# Patient Record
Sex: Male | Born: 2000 | Race: Black or African American | Hispanic: No | Marital: Single | Smoking: Never smoker
Health system: Southern US, Community
[De-identification: ages and names within clinical notes are randomized; demographics above are authoritative.]

## PROBLEM LIST (undated history)

## (undated) DIAGNOSIS — A6000 Herpesviral infection of urogenital system, unspecified: Secondary | ICD-10-CM

## (undated) DIAGNOSIS — G4730 Sleep apnea, unspecified: Secondary | ICD-10-CM

## (undated) HISTORY — DX: Sleep apnea, unspecified: G47.30

---
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
## 2018-01-02 MED FILL — LEVOTHYROXINE 112 MCG TAB: 112 | 30 days supply | Qty: 30 | Fill #2

## 2018-02-07 MED FILL — LEVOTHYROXINE 112 MCG TAB: 112 | 30 days supply | Qty: 30 | Fill #0

## 2018-03-05 MED FILL — LEVOTHYROXINE 112 MCG TAB: 112 | 30 days supply | Qty: 30 | Fill #1

## 2018-04-10 MED FILL — LEVOTHYROXINE 112 MCG TAB: 112 | 30 days supply | Qty: 30 | Fill #0

## 2018-05-11 MED FILL — LEVOTHYROXINE 112 MCG TAB: 112 | 30 days supply | Qty: 30 | Fill #1

## 2018-06-15 MED FILL — LEVOTHYROXINE 112 MCG TAB: 112 | 30 days supply | Qty: 30 | Fill #2

## 2018-07-20 MED FILL — LEVOTHYROXINE 112 MCG TAB: 112 | 30 days supply | Qty: 30 | Fill #3

## 2018-09-03 MED FILL — LEVOTHYROXINE 112 MCG TAB: 112 | 30 days supply | Qty: 30 | Fill #4

## 2018-09-28 MED FILL — LEVOTHYROXINE 112 MCG TAB: 112 | 30 days supply | Qty: 30 | Fill #5

## 2018-11-07 MED FILL — LEVOTHYROXINE 112 MCG TAB: 112 | 30 days supply | Qty: 30 | Fill #6 | Status: TO

## 2018-12-11 MED FILL — LEVOTHYROXINE 112 MCG TAB: 112 | 30 days supply | Qty: 30 | Fill #0

## 2019-01-04 MED FILL — MUPIROCIN 2% OINTMENT: 2 | 10 days supply | Qty: 22 | Fill #0

## 2019-01-04 MED FILL — DOXYCYCLINE HYCLATE 100 MG: 100 | 10 days supply | Qty: 20 | Fill #0

## 2019-01-09 MED FILL — LEVOTHYROXINE 112 MCG TAB: 112 | 30 days supply | Qty: 30 | Fill #1

## 2022-08-31 ENCOUNTER — Encounter (HOSPITAL_COMMUNITY): Payer: Self-pay

## 2022-08-31 ENCOUNTER — Ambulatory Visit (HOSPITAL_COMMUNITY)
Admission: EM | Admit: 2022-08-31 | Discharge: 2022-08-31 | Disposition: A | Discharge status: Home / Self Care | Payer: Medicaid Other | Attending: Urgent Care | Admitting: Urgent Care

## 2022-08-31 DIAGNOSIS — R300 Dysuria: Secondary | ICD-10-CM | POA: Diagnosis present

## 2022-08-31 DIAGNOSIS — L309 Dermatitis, unspecified: Secondary | ICD-10-CM

## 2022-08-31 LAB — POCT URINALYSIS DIPSTICK, ED / UC
Bilirubin Urine: NEGATIVE
Glucose, UA: NEGATIVE mg/dL
Hgb urine dipstick: NEGATIVE
Ketones, ur: NEGATIVE mg/dL
Nitrite: NEGATIVE
Protein, ur: NEGATIVE mg/dL
Specific Gravity, Urine: 1.02 (ref 1.005–1.030)
Urobilinogen, UA: 0.2 mg/dL (ref 0.0–1.0)
pH: 7 (ref 5.0–8.0)

## 2022-08-31 MED ORDER — PREDNISONE 10 MG (21) PO TBPK: ORAL_TABLET | Freq: Every day | ORAL | 0 refills | Status: DC

## 2022-08-31 NOTE — Discharge Instructions (Signed)
Your urine does not show signs of infection. Your discomfort with urination is concerning for possible STD.  We obtained testing for gonorrhea, chlamydia, trichomonas today. We will call with the results if positive. Avoid ALL forms of intercourse until testing/ treating completed. Regarding your rash, you may continue the Benadryl.  I have prescribed you a taper pack of prednisone. Please call your primary care physician and schedule an allergy evaluation as this may be related to something in your workplace.

## 2022-08-31 NOTE — ED Provider Notes (Signed)
[facility]    CSN: [id number] Arrival date & time: 08/31/22  0844      History   Chief Complaint Chief Complaint  Patient presents with   Rash   Exposure to STD    HPI Blake Swanson is a 21 y.o. male.   21 year old male presents today with 2 concerns.  He states he works in a [employer], [employer], states that every time he goes to work he gets itchy bumps on his face.  He states that the bumps respond to over-the-counter hydrocortisone cream and Benadryl.  Reports it is intensely pruritic when the bumps are present.  The rash is primarily around his eyes, nasal bridge, and upper mouth.  Denies any pain or drainage from the rash.  It does not affect any other portion of his body.  He reports on the days he does not work, he does not have the rash.  He denies any systemic symptoms. Additionally, patient reports dysuria for the past week.  He denies any rash, lesions, discharge.  He denies any hematuria, flank pain or fever.   Rash Exposure to STD    Past Medical History:  Diagnosis Date   Sleep apnea     Patient Active Problem List   Diagnosis Date Noted   Obstructive sleep apnea 11/29/2016   Seasonal and perennial allergic rhinitis 11/29/2016   Graves disease 11/29/2016    History reviewed. No pertinent surgical history.     Home Medications    Prior to Admission medications   Medication Sig Start Date End Date Taking? Authorizing Provider  predniSONE (STERAPRED UNI-PAK 21 TAB) 10 MG (21) TBPK tablet Take by mouth daily. Take 6 tabs by mouth daily  for 1 days, then 5 tabs for 1 days, then 4 tabs for 1 days, then 3 tabs for 1 days, 2 tabs for 1 days, then 1 tab by mouth daily for 1 days 08/31/22  Yes Shamel Germond L, PA  ALLEGRA-D ALLERGY & CONGESTION 180-240 MG 24 hr tablet Take 1 tablet by mouth daily. 11/24/16   [provider]  cholecalciferol (VITAMIN D) 1000 units tablet Take 1,000 Units by mouth daily.    [provider]  levothyroxine (SYNTHROID, LEVOTHROID) 100 MCG tablet Take 1 tablet by mouth daily. 11/24/16   [provider]    Family History Family History  Problem Relation Age of Onset   Hypertension Maternal Grandmother     Social History Social History   Tobacco Use   Smoking status: Never   Smokeless tobacco: Never  Vaping Use   Vaping Use: Never used  Substance Use Topics   Alcohol use: No   Drug use: No     Allergies   Patient has no known allergies.   Review of Systems Review of Systems  Genitourinary:  Positive for dysuria.  Skin:  Positive for rash.  All other systems reviewed and are negative.    Physical Exam Triage Vital Signs ED Triage Vitals  Enc Vitals Group     BP 08/31/22 1116 (!) 141/91     Pulse Rate 08/31/22 1116 64     Resp 08/31/22 1116 16     Temp 08/31/22 1116 98 F (36.7 C)     Temp Source 08/31/22 1116 Oral     SpO2 08/31/22 1116 98 %     Weight --      Height --      Head Circumference --      Peak Flow --  Pain Score 08/31/22 1114 6     Pain Loc --      Pain Edu? --      Excl. in [place]? --    No data found.  Updated Vital Signs BP (!) 141/91 (BP Location: Left Arm)   Pulse 64   Temp 98 F (36.7 C) (Oral)   Resp 16   SpO2 98%   Visual Acuity Right Eye Distance:   Left Eye Distance:   Bilateral Distance:    Right Eye Near:   Left Eye Near:    Bilateral Near:     Physical Exam Vitals and nursing note reviewed.  Constitutional:      General: He is not in acute distress.    Appearance: Normal appearance. He is not ill-appearing, toxic-appearing or diaphoretic.  HENT:     Head: Normocephalic and atraumatic.     Right Ear: External ear normal.     Left Ear: External ear normal.     Nose: Nose normal. No congestion or rhinorrhea.     Mouth/Throat:     Mouth: Mucous membranes are moist.     Pharynx: Oropharynx is clear. No oropharyngeal exudate or posterior oropharyngeal erythema.  Eyes:     General: No  scleral icterus.       Right eye: No discharge.        Left eye: No discharge.     Extraocular Movements: Extraocular movements intact.     Conjunctiva/sclera: Conjunctivae normal.     Pupils: Pupils are equal, round, and reactive to light.  Cardiovascular:     Rate and Rhythm: Normal rate.  Pulmonary:     Effort: Pulmonary effort is normal. No respiratory distress.  Musculoskeletal:     Cervical back: Normal range of motion and neck supple. No rigidity or tenderness.  Lymphadenopathy:     Cervical: No cervical adenopathy.  Skin:    General: Skin is warm and dry.     Findings: Rash (fine papules scattered around T zone of face, including periocular regions. None noted around mouth at present time.) present. No erythema.  Neurological:     General: No focal deficit present.     Mental Status: He is alert and oriented to person, place, and time.     Sensory: No sensory deficit.     Motor: No weakness.      UC Treatments / Results  Labs (all labs ordered are listed, but only abnormal results are displayed) Labs Reviewed  POCT URINALYSIS DIPSTICK, ED / UC - Abnormal; Notable for the following components:      Result Value   Leukocytes,Ua TRACE (*)    All other components within normal limits  CYTOLOGY, (ORAL, ANAL, URETHRAL) ANCILLARY ONLY    EKG   Radiology No results found.  Procedures Procedures (including critical care time)  Medications Ordered in UC Medications - No data to display  Initial Impression / Assessment and Plan / UC Course  I have reviewed the triage vital signs and the nursing notes.  Pertinent labs & imaging results that were available during my care of the patient were reviewed by me and considered in my medical decision making (see chart for details).     Dysuria -urine is not indicative of a urinary tract infection.  Clinically concerning for an STD.  Urethral swab obtained, patient to avoid sexual intercourse until testing completed and  treatment completed if applicable. Periocular dermatitis -patient skin rash appears to be related to some type of allergen in his workplace.  He does find some response to Benadryl and hydrocortisone.  Will place him on a prednisone taper pack.  I encouraged him to call his PCP and schedule a follow-up to consider allergy testing.  Final Clinical Impressions(s) / UC Diagnoses   Final diagnoses:  Dysuria  Periocular dermatitis     Discharge Instructions      Your urine does not show signs of infection. Your discomfort with urination is concerning for possible STD.  We obtained testing for gonorrhea, chlamydia, trichomonas today. We will call with the results if positive. Avoid ALL forms of intercourse until testing/ treating completed. Regarding your rash, you may continue the Benadryl.  I have prescribed you a taper pack of prednisone. Please call your primary care physician and schedule an allergy evaluation as this may be related to something in your workplace.     ED Prescriptions     Medication Sig Dispense Auth. Provider   predniSONE (STERAPRED UNI-PAK 21 TAB) 10 MG (21) TBPK tablet Take by mouth daily. Take 6 tabs by mouth daily  for 1 days, then 5 tabs for 1 days, then 4 tabs for 1 days, then 3 tabs for 1 days, 2 tabs for 1 days, then 1 tab by mouth daily for 1 days 21 tablet Jeziah Kretschmer L, PA      PDMP not reviewed this encounter.   Maretta Bees, [place] 08/31/22 1150

## 2022-08-31 NOTE — ED Triage Notes (Signed)
Pt is here for possible allergic reaction on face . P[t states the rash flares up when at work. Pt also would like to be to checked for possible STD due to burning when urinating x1wk

## 2022-09-01 LAB — CYTOLOGY, (ORAL, ANAL, URETHRAL) ANCILLARY ONLY
Chlamydia: POSITIVE — AB
Comment: NEGATIVE
Comment: NEGATIVE
Comment: NORMAL
Neisseria Gonorrhea: NEGATIVE
Trichomonas: NEGATIVE

## 2022-09-02 ENCOUNTER — Telehealth (HOSPITAL_COMMUNITY): Payer: Self-pay | Admitting: Emergency Medicine

## 2022-09-02 MED ORDER — DOXYCYCLINE HYCLATE 100 MG PO CAPS: 100.0000 mg | ORAL_CAPSULE | Freq: Two times a day (BID) | ORAL | 0 refills | Status: AC

## 2022-09-10 ENCOUNTER — Ambulatory Visit (HOSPITAL_COMMUNITY)
Admission: EM | Admit: 2022-09-10 | Discharge: 2022-09-10 | Disposition: A | Discharge status: Home / Self Care | Payer: Medicaid Other | Attending: Emergency Medicine | Admitting: Emergency Medicine

## 2022-09-10 ENCOUNTER — Encounter (HOSPITAL_COMMUNITY): Payer: Self-pay

## 2022-09-10 DIAGNOSIS — R82998 Other abnormal findings in urine: Secondary | ICD-10-CM | POA: Diagnosis present

## 2022-09-10 LAB — POCT URINALYSIS DIPSTICK, ED / UC
Bilirubin Urine: NEGATIVE
Glucose, UA: NEGATIVE mg/dL
Hgb urine dipstick: NEGATIVE
Ketones, ur: NEGATIVE mg/dL
Leukocytes,Ua: NEGATIVE
Nitrite: NEGATIVE
Protein, ur: NEGATIVE mg/dL
Specific Gravity, Urine: 1.01 (ref 1.005–1.030)
Urobilinogen, UA: 0.2 mg/dL (ref 0.0–1.0)
pH: 6.5 (ref 5.0–8.0)

## 2022-09-10 NOTE — ED Provider Notes (Signed)
[facility]    CSN: [id number] Arrival date & time: 09/10/22  1003      History   Chief Complaint Chief Complaint  Patient presents with   Exposure to STD    HPI Blake Swanson is a 22 y.o. male.   Patient presents for evaluation of persisting dark urine for 2 weeks.  Was evaluated in urgent care on 08/31/2022, diagnosed with chlamydia, endorses completing doxycycline course 2 days ago however symptoms did not resolve.  Requesting recheck.  Denies new symptoms.  Denies sexual activity since beginning treatment.    Past Medical History:  Diagnosis Date   Sleep apnea     Patient Active Problem List   Diagnosis Date Noted   Obstructive sleep apnea 11/29/2016   Seasonal and perennial allergic rhinitis 11/29/2016   Graves disease 11/29/2016    No past surgical history on file.     Home Medications    Prior to Admission medications   Medication Sig Start Date End Date Taking? Authorizing Provider  ALLEGRA-D ALLERGY & CONGESTION 180-240 MG 24 hr tablet Take 1 tablet by mouth daily. 11/24/16   [provider]  cholecalciferol (VITAMIN D) 1000 units tablet Take 1,000 Units by mouth daily.    [provider]  levothyroxine (SYNTHROID, LEVOTHROID) 100 MCG tablet Take 1 tablet by mouth daily. 11/24/16   [provider]  predniSONE (STERAPRED UNI-PAK 21 TAB) 10 MG (21) TBPK tablet Take by mouth daily. Take 6 tabs by mouth daily  for 1 days, then 5 tabs for 1 days, then 4 tabs for 1 days, then 3 tabs for 1 days, 2 tabs for 1 days, then 1 tab by mouth daily for 1 days 08/31/22   Chaney Malling, PA    Family History Family History  Problem Relation Age of Onset   Hypertension Maternal Grandmother     Social History Social History   Tobacco Use   Smoking status: Never   Smokeless tobacco: Never  Vaping Use   Vaping Use: Never used  Substance Use Topics   Alcohol use: No   Drug use: No     Allergies   Patient has no known  allergies.   Review of Systems Review of Systems   Physical Exam Triage Vital Signs ED Triage Vitals  Enc Vitals Group     BP 09/10/22 1015 (!) 149/84     Pulse Rate 09/10/22 1015 (!) 53     Resp 09/10/22 1015 16     Temp 09/10/22 1015 98.3 F (36.8 C)     Temp Source 09/10/22 1015 Oral     SpO2 09/10/22 1015 98 %     Weight --      Height --      Head Circumference --      Peak Flow --      Pain Score 09/10/22 1023 0     Pain Loc --      Pain Edu? --      Excl. in [place]? --    No data found.  Updated Vital Signs BP (!) 149/84 (BP Location: Left Arm)   Pulse (!) 53   Temp 98.3 F (36.8 C) (Oral)   Resp 16   SpO2 98%   Visual Acuity Right Eye Distance:   Left Eye Distance:   Bilateral Distance:    Right Eye Near:   Left Eye Near:    Bilateral Near:     Physical Exam Constitutional:      Appearance:  Normal appearance.  Eyes:     Extraocular Movements: Extraocular movements intact.  Pulmonary:     Effort: Pulmonary effort is normal.  Genitourinary:    Penis: Normal and uncircumcised. No discharge.      Testes: Normal.  Neurological:     Mental Status: He is alert and oriented to person, place, and time. Mental status is at baseline.      UC Treatments / Results  Labs (all labs ordered are listed, but only abnormal results are displayed) Labs Reviewed - No data to display  EKG   Radiology No results found.  Procedures Procedures (including critical care time)  Medications Ordered in UC Medications - No data to display  Initial Impression / Assessment and Plan / UC Course  I have reviewed the triage vital signs and the nursing notes.  Pertinent labs & imaging results that were available during my care of the patient were reviewed by me and considered in my medical decision making (see chart for details).  Dark urine  Urinalysis negative, STI labs pending, advised increase fluid intake and monitoring, may follow-up with urgent care if  symptoms persist or worsen Final Clinical Impressions(s) / UC Diagnoses   Final diagnoses:  None   Discharge Instructions   None    ED Prescriptions   None    PDMP not reviewed this encounter.   Valinda Hoar, NP 09/10/22 1059

## 2022-09-10 NOTE — Discharge Instructions (Addendum)
Urinalysis is negative for bladder infection  STI labs are pending, you will be treated based on test results, you will be notified of positive test results only and treatment sent in at time of notification, you will have to return to clinic if positive for gonorrhea  Please increase your fluid intake to further help flush out your kidneys bladder  Please refrain from having sex until all lab results have returned

## 2022-09-10 NOTE — ED Triage Notes (Signed)
Pt is here for brownish penile discharge x 2 days.

## 2022-09-12 LAB — CYTOLOGY, (ORAL, ANAL, URETHRAL) ANCILLARY ONLY
Chlamydia: POSITIVE — AB
Comment: NEGATIVE
Comment: NEGATIVE
Comment: NORMAL
Neisseria Gonorrhea: NEGATIVE
Trichomonas: NEGATIVE

## 2022-09-13 ENCOUNTER — Telehealth (HOSPITAL_COMMUNITY): Payer: Self-pay | Admitting: Emergency Medicine

## 2022-09-13 MED ORDER — DOXYCYCLINE HYCLATE 100 MG PO CAPS: 100.0000 mg | ORAL_CAPSULE | Freq: Two times a day (BID) | ORAL | 0 refills | Status: AC

## 2022-09-22 ENCOUNTER — Encounter (HOSPITAL_COMMUNITY): Payer: Self-pay

## 2022-09-22 ENCOUNTER — Ambulatory Visit (HOSPITAL_COMMUNITY)
Admission: EM | Admit: 2022-09-22 | Discharge: 2022-09-22 | Disposition: A | Discharge status: Home / Self Care | Payer: Medicaid Other | Attending: Sports Medicine | Admitting: Sports Medicine

## 2022-09-22 DIAGNOSIS — Z113 Encounter for screening for infections with a predominantly sexual mode of transmission: Secondary | ICD-10-CM

## 2022-09-22 DIAGNOSIS — S39012A Strain of muscle, fascia and tendon of lower back, initial encounter: Secondary | ICD-10-CM | POA: Insufficient documentation

## 2022-09-22 LAB — POCT URINALYSIS DIPSTICK, ED / UC
Bilirubin Urine: NEGATIVE
Glucose, UA: NEGATIVE mg/dL
Hgb urine dipstick: NEGATIVE
Ketones, ur: NEGATIVE mg/dL
Leukocytes,Ua: NEGATIVE
Nitrite: NEGATIVE
Protein, ur: NEGATIVE mg/dL
Specific Gravity, Urine: 1.01 (ref 1.005–1.030)
Urobilinogen, UA: 0.2 mg/dL (ref 0.0–1.0)
pH: 6.5 (ref 5.0–8.0)

## 2022-09-22 MED ORDER — METHOCARBAMOL 500 MG PO TABS: 500.0000 mg | ORAL_TABLET | Freq: Every evening | ORAL | 0 refills | Status: DC

## 2022-09-22 NOTE — ED Triage Notes (Signed)
Patient presenting for follow up of chlamydia positive. Finished antibiotics and wants another test.   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 No urinary symptoms.

## 2022-09-22 NOTE — ED Provider Notes (Addendum)
[facility]    CSN: [id number] Arrival date & time: 09/22/22  0802      History   Chief Complaint Chief Complaint  Patient presents with   SEXUALLY TRANSMITTED DISEASE    HPI Blake Swanson is a 22 y.o. male.  Patient presents requesting STD retesting.  Patient was previously seen on 08/31/2022 and 09/10/2022 at this clinic, he initially tested positive for chlamydia and stated he began taking the doxycycline treatment on 09/02/2022, he reports that he finished the antibiotic treatment and had another positive chlamydia test on his second visit on 09/10/2022, he states that he has not had any sexual activity since before his initial visit in December.  He received education from our staff RN on CDC protocols for retesting.  He reports that he would like to be retested now.  He denies any STD symptoms, he denies any penile drainage, testicular pain, pelvic pain, abdominal pain, fever, chills, or any urinary symptoms.  He is requesting a urinalysis.  Patient presents complaining of lower bilateral back pain that started over this past week.  He reports that onset of symptoms began while he was at work lifting items.  He reports that symptoms worsen with lifting objects or bending.  He denies any fall or trauma.  He denies any numbness or tingling down any of his lower extremities.  He denies any urinary symptoms.  He denies any previous injury to his back in the past.  He denies any previous orthopedic surgeries on his back.  He has taken Tylenol and used a heating pad with minimal relief of symptoms.  HPI  Past Medical History:  Diagnosis Date   Sleep apnea     Patient Active Problem List   Diagnosis Date Noted   Obstructive sleep apnea 11/29/2016   Seasonal and perennial allergic rhinitis 11/29/2016   Graves disease 11/29/2016    History reviewed. No pertinent surgical history.     Home Medications    Prior to Admission medications   Medication Sig Start Date End  Date Taking? Authorizing Provider  ALLEGRA-D ALLERGY & CONGESTION 180-240 MG 24 hr tablet Take 1 tablet by mouth daily. 11/24/16  Yes [provider]  levothyroxine (SYNTHROID, LEVOTHROID) 100 MCG tablet Take 1 tablet by mouth daily. 11/24/16  Yes [provider]  methocarbamol (ROBAXIN) 500 MG tablet Take 1 tablet (500 mg total) by mouth at bedtime. 09/22/22  Yes Flossie Dibble, NP  cholecalciferol (VITAMIN D) 1000 units tablet Take 1,000 Units by mouth daily.    [provider]    Family History Family History  Problem Relation Age of Onset   Hypertension Maternal Grandmother     Social History Social History   Tobacco Use   Smoking status: Never   Smokeless tobacco: Never  Vaping Use   Vaping Use: Never used  Substance Use Topics   Alcohol use: No   Drug use: No     Allergies   Patient has no known allergies.   Review of Systems Review of Systems Per HPI  Physical Exam Triage Vital Signs ED Triage Vitals  Enc Vitals Group     BP 09/22/22 0829 (!) 141/92     Pulse Rate 09/22/22 0829 (!) 53     Resp 09/22/22 0829 16     Temp 09/22/22 0829 97.9 F (36.6 C)     Temp Source 09/22/22 0829 Oral     SpO2 09/22/22 0829 98 %     Weight 09/22/22 0829 170  lb (77.1 kg)     Height 09/22/22 0829 5\' 6"  (1.676 m)     Head Circumference --      Peak Flow --      Pain Score 09/22/22 0828 4     Pain Loc --      Pain Edu? --      Excl. in [place]? --    No data found.  Updated Vital Signs BP (!) 141/92 (BP Location: Left Arm)   Pulse (!) 53   Temp 97.9 F (36.6 C) (Oral)   Resp 16   Ht 5\' 6"  (1.676 m)   Wt 170 lb (77.1 kg)   SpO2 98%   BMI 27.44 kg/m      Physical Exam Vitals and nursing note reviewed.  Constitutional:      Appearance: Normal appearance.  Genitourinary:    Comments: Deferred exam Musculoskeletal:     Cervical back: Normal.     Thoracic back: Normal.     Lumbar back: No swelling, edema, spasms, tenderness or bony  tenderness. Normal range of motion. Negative right straight leg raise test and negative left straight leg raise test.       Back:     Comments: No abnormalities noted to the skin on the lumbar area, no rash, no bruising, no erythema, or any changes to the skin.   Neurological:     Mental Status: He is alert.      UC Treatments / Results  Labs (all labs ordered are listed, but only abnormal results are displayed) Labs Reviewed  POCT URINALYSIS DIPSTICK, ED / UC  CYTOLOGY, (ORAL, ANAL, URETHRAL) ANCILLARY ONLY    EKG   Radiology No results found.  Procedures Procedures (including critical care time)  Medications Ordered in UC Medications - No data to display  Initial Impression / Assessment and Plan / UC Course  I have reviewed the triage vital signs and the nursing notes.  Pertinent labs & imaging results that were available during my care of the patient were reviewed by me and considered in my medical decision making (see chart for details).     Patient was evaluated for STD screening and strain of lumbar region.  Urinalysis was unremarkable.  Cytology swab is pending.  Patient was made aware of CDC protocol with normal retesting taken place and 3 months from initial encounter, patient was educated on the possibility that a false positive result could occur.  He was made aware of results reporting protocol and MyChart. Patient was prescribed Robaxin for lumbar strain.  Patient was made aware prescribed regiment and he was made aware of safety precautions with muscle relaxant.  Patient was educated on using proper body mechanics at work.  Patient was made aware of timeline for symptom resolution.  Patient verbalized understanding of instructions.  Charting was provided using a a verbal dictation system, charting was proofread for errors, errors may occur which could change the meaning of the information charted.   Final Clinical Impressions(s) / UC Diagnoses   Final  diagnoses:  Screening for STD (sexually transmitted disease)  Strain of lumbar region, initial encounter     Discharge Instructions      We will call you if any of your test results warrant a change in plan of care, you may view these test results on MyChart.   Robaxin is a muscle relaxant, its been sent to the pharmacy.  Please take 1 tablet before bedtime.  Please do not operate any heavy machinery or drive  a car after taking this medication.  You may continue to use a heating pad on the affected area.  Please return if your symptoms or not improving.   Please remember to use proper body mechanics while at work when lifting objects.      ED Prescriptions     Medication Sig Dispense Auth. Provider   methocarbamol (ROBAXIN) 500 MG tablet Take 1 tablet (500 mg total) by mouth at bedtime. 6 tablet Debby Freiberg, NP      PDMP not reviewed this encounter.   Debby Freiberg, NP 09/22/22 1009    Debby Freiberg, NP 09/22/22 1010

## 2022-09-22 NOTE — Discharge Instructions (Addendum)
We will call you if any of your test results warrant a change in plan of care, you may view these test results on MyChart.   Robaxin is a muscle relaxant, its been sent to the pharmacy.  Please take 1 tablet before bedtime.  Please do not operate any heavy machinery or drive a car after taking this medication.  You may continue to use a heating pad on the affected area.  Please return if your symptoms or not improving.   Please remember to use proper body mechanics while at work when lifting objects.

## 2022-09-23 LAB — CYTOLOGY, (ORAL, ANAL, URETHRAL) ANCILLARY ONLY
Chlamydia: NEGATIVE
Comment: NEGATIVE
Comment: NEGATIVE
Comment: NORMAL
Neisseria Gonorrhea: NEGATIVE
Trichomonas: NEGATIVE

## 2023-06-08 ENCOUNTER — Ambulatory Visit (HOSPITAL_COMMUNITY): Payer: Medicaid Other

## 2023-11-11 DIAGNOSIS — Z7251 High risk heterosexual behavior: Secondary | ICD-10-CM | POA: Diagnosis not present

## 2023-11-24 DIAGNOSIS — R0789 Other chest pain: Secondary | ICD-10-CM | POA: Diagnosis not present

## 2023-11-24 DIAGNOSIS — R7989 Other specified abnormal findings of blood chemistry: Secondary | ICD-10-CM | POA: Diagnosis not present

## 2023-11-24 DIAGNOSIS — L853 Xerosis cutis: Secondary | ICD-10-CM | POA: Diagnosis not present

## 2023-11-27 DIAGNOSIS — E291 Testicular hypofunction: Secondary | ICD-10-CM | POA: Diagnosis not present

## 2023-12-07 DIAGNOSIS — E890 Postprocedural hypothyroidism: Secondary | ICD-10-CM | POA: Diagnosis not present

## 2023-12-23 DIAGNOSIS — Z7251 High risk heterosexual behavior: Secondary | ICD-10-CM | POA: Diagnosis not present

## 2023-12-23 DIAGNOSIS — Z113 Encounter for screening for infections with a predominantly sexual mode of transmission: Secondary | ICD-10-CM | POA: Diagnosis not present

## 2023-12-25 DIAGNOSIS — J3089 Other allergic rhinitis: Secondary | ICD-10-CM | POA: Diagnosis not present

## 2023-12-25 DIAGNOSIS — J301 Allergic rhinitis due to pollen: Secondary | ICD-10-CM | POA: Diagnosis not present

## 2023-12-25 DIAGNOSIS — H1045 Other chronic allergic conjunctivitis: Secondary | ICD-10-CM | POA: Diagnosis not present

## 2023-12-25 DIAGNOSIS — J3081 Allergic rhinitis due to animal (cat) (dog) hair and dander: Secondary | ICD-10-CM | POA: Diagnosis not present

## 2023-12-29 DIAGNOSIS — N489 Disorder of penis, unspecified: Secondary | ICD-10-CM | POA: Diagnosis not present

## 2023-12-29 DIAGNOSIS — L304 Erythema intertrigo: Secondary | ICD-10-CM | POA: Diagnosis not present

## 2024-01-15 DIAGNOSIS — B009 Herpesviral infection, unspecified: Secondary | ICD-10-CM | POA: Diagnosis not present

## 2024-01-18 DIAGNOSIS — A6001 Herpesviral infection of penis: Secondary | ICD-10-CM | POA: Diagnosis not present

## 2024-01-18 DIAGNOSIS — L309 Dermatitis, unspecified: Secondary | ICD-10-CM | POA: Diagnosis not present

## 2024-02-02 DIAGNOSIS — L304 Erythema intertrigo: Secondary | ICD-10-CM | POA: Diagnosis not present

## 2024-02-02 DIAGNOSIS — L308 Other specified dermatitis: Secondary | ICD-10-CM | POA: Diagnosis not present

## 2024-02-17 DIAGNOSIS — N489 Disorder of penis, unspecified: Secondary | ICD-10-CM | POA: Diagnosis not present

## 2024-03-18 DIAGNOSIS — Z13220 Encounter for screening for lipoid disorders: Secondary | ICD-10-CM | POA: Diagnosis not present

## 2024-03-18 DIAGNOSIS — E890 Postprocedural hypothyroidism: Secondary | ICD-10-CM | POA: Diagnosis not present

## 2024-03-18 DIAGNOSIS — L853 Xerosis cutis: Secondary | ICD-10-CM | POA: Diagnosis not present

## 2024-03-18 DIAGNOSIS — L309 Dermatitis, unspecified: Secondary | ICD-10-CM | POA: Diagnosis not present

## 2024-03-18 DIAGNOSIS — Z131 Encounter for screening for diabetes mellitus: Secondary | ICD-10-CM | POA: Diagnosis not present

## 2024-03-18 DIAGNOSIS — B009 Herpesviral infection, unspecified: Secondary | ICD-10-CM | POA: Diagnosis not present

## 2024-03-18 DIAGNOSIS — Z79624 Long term (current) use of inhibitors of nucleotide synthesis: Secondary | ICD-10-CM | POA: Diagnosis not present

## 2024-03-18 DIAGNOSIS — Z0000 Encounter for general adult medical examination without abnormal findings: Secondary | ICD-10-CM | POA: Diagnosis not present

## 2024-03-18 DIAGNOSIS — E559 Vitamin D deficiency, unspecified: Secondary | ICD-10-CM | POA: Diagnosis not present

## 2024-03-20 ENCOUNTER — Other Ambulatory Visit (HOSPITAL_COMMUNITY): Payer: Self-pay

## 2024-03-21 ENCOUNTER — Other Ambulatory Visit (HOSPITAL_COMMUNITY): Payer: Self-pay

## 2024-03-22 ENCOUNTER — Other Ambulatory Visit (HOSPITAL_COMMUNITY): Payer: Self-pay

## 2024-03-25 ENCOUNTER — Other Ambulatory Visit (HOSPITAL_COMMUNITY): Payer: Self-pay

## 2024-03-25 ENCOUNTER — Other Ambulatory Visit: Payer: Self-pay

## 2024-03-25 MED ORDER — KETOCONAZOLE 2 % EX CREA: 1.0000 | TOPICAL_CREAM | Freq: Two times a day (BID) | CUTANEOUS | 3 refills | Status: DC

## 2024-03-25 MED ORDER — FLUTICASONE PROPIONATE 0.05 % EX CREA: 1.0000 | TOPICAL_CREAM | Freq: Two times a day (BID) | CUTANEOUS | 3 refills | Status: AC | PRN

## 2024-03-25 MED ORDER — AZELASTINE HCL 0.05 % OP SOLN: 1.0000 [drp] | Freq: Two times a day (BID) | OPHTHALMIC | 3 refills | Status: DC

## 2024-03-25 MED ORDER — LEVOCETIRIZINE DIHYDROCHLORIDE 5 MG PO TABS: 5.0000 mg | ORAL_TABLET | Freq: Every evening | ORAL | 3 refills | Status: AC

## 2024-03-25 MED ORDER — LEVOTHYROXINE SODIUM 125 MCG PO TABS
125.0000 ug | ORAL_TABLET | Freq: Every day | ORAL | 2 refills | Status: AC
  Filled 2024-03-25: qty 90, 90d supply, fill #0
  Filled 2024-06-24: qty 90, 90d supply, fill #1
  Filled 2024-09-11 (×2): qty 90, 90d supply, fill #2

## 2024-03-25 MED ORDER — FLUTICASONE PROPIONATE 50 MCG/ACT NA SUSP: 1.0000 | Freq: Every day | NASAL | 3 refills | Status: AC

## 2024-03-25 MED ORDER — VALACYCLOVIR HCL 500 MG PO TABS
500.0000 mg | ORAL_TABLET | Freq: Every day | ORAL | 3 refills | Status: AC
  Filled 2024-10-02: qty 90, 90d supply, fill #0

## 2024-03-26 ENCOUNTER — Other Ambulatory Visit (HOSPITAL_COMMUNITY): Payer: Self-pay

## 2024-03-29 ENCOUNTER — Ambulatory Visit (HOSPITAL_COMMUNITY)
Admission: EM | Admit: 2024-03-29 | Discharge: 2024-03-29 | Disposition: A | Discharge status: Home / Self Care | Attending: Physician Assistant | Admitting: Physician Assistant

## 2024-03-29 ENCOUNTER — Ambulatory Visit (HOSPITAL_COMMUNITY): Payer: Self-pay

## 2024-03-29 ENCOUNTER — Encounter (HOSPITAL_COMMUNITY): Payer: Self-pay

## 2024-03-29 DIAGNOSIS — Z113 Encounter for screening for infections with a predominantly sexual mode of transmission: Secondary | ICD-10-CM | POA: Diagnosis not present

## 2024-03-29 DIAGNOSIS — N5089 Other specified disorders of the male genital organs: Secondary | ICD-10-CM | POA: Insufficient documentation

## 2024-03-29 LAB — HIV ANTIBODY (ROUTINE TESTING W REFLEX): HIV Screen 4th Generation wRfx: NONREACTIVE

## 2024-03-29 NOTE — ED Provider Notes (Signed)
 [facility]    CSN: [id number] Arrival date & time: 03/29/24  0801      History   Chief Complaint No chief complaint on file.   HPI Blake Swanson is a 23 y.o. male.   Patient presents today requesting STI testing.  He reports an unprotected sexual encounter with 2 individuals about 2 weeks ago and so would like a full panel.  He has not experienced any symptoms including penile discharge, dysuria, frequency, urgency.  He did have a small lesion on the shaft of his penis and wanted to make sure that this was not herpes.  He denies any associated vesicles, ulcerations, pain.  He has been tested for HSV in May 2025 and this was negative.  He denies any recent antibiotics.  He has had chlamydia in the past and took the medication and had resolution of symptoms but denies additional history of STI.    Past Medical History:  Diagnosis Date   Sleep apnea     Patient Active Problem List   Diagnosis Date Noted   Obstructive sleep apnea 11/29/2016   Seasonal and perennial allergic rhinitis 11/29/2016   Graves disease 11/29/2016    History reviewed. No pertinent surgical history.     Home Medications    Prior to Admission medications   Medication Sig Start Date End Date Taking? Authorizing Provider  fluticasone  (CUTIVATE ) 0.05 % cream Apply 1 Application topically to affected areas up to 2 (two) times daily as needed. 02/02/24  Yes Shona Rush, MD  levocetirizine (XYZAL ) 5 MG tablet Take 1 tablet (5 mg total) by mouth every evening. 12/29/23  Yes Frutoso Luz, MD  levothyroxine  (SYNTHROID ) 125 MCG tablet Take 1 tablet (125 mcg total) by mouth daily. 03/20/24  Yes   ALLEGRA-D ALLERGY & CONGESTION 180-240 MG 24 hr tablet Take 1 tablet by mouth daily. 11/24/16   [provider]  fluticasone  (FLONASE ) 50 MCG/ACT nasal spray Place 1-2 sprays into both nostrils daily. 12/25/23   Frutoso Luz, MD  levothyroxine  (SYNTHROID , LEVOTHROID) 100 MCG tablet Take 1 tablet by  mouth daily. 11/24/16   [provider]  valACYclovir  (VALTREX ) 500 MG tablet Take 1 tablet (500 mg total) by mouth daily. 01/18/24   Jesus Elberta Gainer, FNP    Family History Family History  Problem Relation Age of Onset   Hypertension Maternal Grandmother     Social History Social History   Tobacco Use   Smoking status: Never   Smokeless tobacco: Never  Vaping Use   Vaping status: Never Used  Substance Use Topics   Alcohol use: No   Drug use: No     Allergies   Patient has no known allergies.   Review of Systems Review of Systems  Constitutional:  Negative for activity change, appetite change, fatigue and fever.  Gastrointestinal:  Negative for abdominal pain, diarrhea, nausea and vomiting.  Genitourinary:  Positive for genital sores. Negative for dysuria, frequency, penile discharge, penile pain and urgency.     Physical Exam Triage Vital Signs ED Triage Vitals [03/29/24 0814]  Encounter Vitals Group     BP (!) 134/92     Girls Systolic BP Percentile      Girls Diastolic BP Percentile      Boys Systolic BP Percentile      Boys Diastolic BP Percentile      Pulse Rate 83     Resp 18     Temp 98 F (36.7 C)     Temp Source Oral  SpO2 96 %     Weight      Height      Head Circumference      Peak Flow      Pain Score      Pain Loc      Pain Education      Exclude from Growth Chart    No data found.  Updated Vital Signs BP (!) 134/92 (BP Location: Left Arm)   Pulse 83   Temp 98 F (36.7 C) (Oral)   Resp 18   SpO2 96%   Visual Acuity Right Eye Distance:   Left Eye Distance:   Bilateral Distance:    Right Eye Near:   Left Eye Near:    Bilateral Near:     Physical Exam Vitals reviewed. Exam conducted with a chaperone present.  Constitutional:      General: He is awake.     Appearance: Normal appearance. He is well-developed. He is not ill-appearing.     Comments: Very pleasant male appears stated age in no acute distress  sitting comfortably in exam room  HENT:     Head: Normocephalic and atraumatic.  Cardiovascular:     Rate and Rhythm: Normal rate and regular rhythm.     Heart sounds: Normal heart sounds, S1 normal and S2 normal. No murmur heard. Pulmonary:     Effort: Pulmonary effort is normal.     Breath sounds: Normal breath sounds. No stridor. No wheezing, rhonchi or rales.     Comments: Clear to auscultation bilaterally Abdominal:     Palpations: Abdomen is soft.     Tenderness: There is no abdominal tenderness.  Genitourinary:    Penis: Circumcised. Lesions present.      Testes: Normal.     Comments: Small (approximately 2 mm) raised hyperpigmented lesion noted along shaft of penis.  No additional lesions noted.  No vesicles noted. Neurological:     Mental Status: He is alert.  Psychiatric:        Behavior: Behavior is cooperative.      UC Treatments / Results  Labs (all labs ordered are listed, but only abnormal results are displayed) Labs Reviewed  RPR  HIV ANTIBODY (ROUTINE TESTING W REFLEX)  HSV 1/2 PCR (SURFACE)  CYTOLOGY, (ORAL, ANAL, URETHRAL) ANCILLARY ONLY    EKG   Radiology No results found.  Procedures Procedures (including critical care time)  Medications Ordered in UC Medications - No data to display  Initial Impression / Assessment and Plan / UC Course  I have reviewed the triage vital signs and the nursing notes.  Pertinent labs & imaging results that were available during my care of the patient were reviewed by me and considered in my medical decision making (see chart for details).     Patient is well-appearing, afebrile, nontoxic, nontachycardic.  He is not currently experiencing his symptoms and so we will defer treatment until results are available.  Complete STI panel was obtained.  He has had negative hepatitis C testing in the past and so this was not repeated.  He does have a small lesion on his penis but I suspect this is more related to  irritation of the skin given its appearance.  We did swab this for HSV but will defer treatment until results are available.  We discussed that if he develops any additional symptoms he needs to be seen immediately including penile discharge, genital lesion, fever, nausea, vomiting.  I did recommend that he return in 1 month for repeat  testing of HIV and syphilis given it is not always positive this close to the encounter.  All questions were answered to patient satisfaction.  Final Clinical Impressions(s) / UC Diagnoses   Final diagnoses:  Screening examination for STI  Genital lesion, male     Discharge Instructions      Monitor your MyChart for your results.  We will contact you if anything is positive will need to arrange treatment.  Obtained from sex and to receive your results.  Use a condom with each sexual encounter.  I do recommend you return in approximately 4 weeks for repeat testing as it is possible for HIV and syphilis testing to be negative this close to an encounter.  If you develop any symptoms including additional lesions, penile discharge, fever, flulike illness, nausea, vomiting you need to be seen immediately.     ED Prescriptions   None    PDMP not reviewed this encounter.   Sherrell Rocky POUR, PA-C 03/29/24 [phone]

## 2024-03-29 NOTE — ED Triage Notes (Signed)
 Patient states he had recent unprotected sex for two different people. Patient would like STI-testing with blood work.  Patient also has a lesion on his penis.

## 2024-03-29 NOTE — Discharge Instructions (Signed)
 Monitor your MyChart for your results.  We will contact you if anything is positive will need to arrange treatment.  Obtained from sex and to receive your results.  Use a condom with each sexual encounter.  I do recommend you return in approximately 4 weeks for repeat testing as it is possible for HIV and syphilis testing to be negative this close to an encounter.  If you develop any symptoms including additional lesions, penile discharge, fever, flulike illness, nausea, vomiting you need to be seen immediately.

## 2024-03-30 LAB — RPR: RPR Ser Ql: NONREACTIVE

## 2024-03-30 LAB — HSV 1/2 PCR (SURFACE)
HSV-1 DNA: NOT DETECTED
HSV-2 DNA: NOT DETECTED

## 2024-04-01 LAB — CYTOLOGY, (ORAL, ANAL, URETHRAL) ANCILLARY ONLY
Chlamydia: NEGATIVE
Comment: NEGATIVE
Comment: NEGATIVE
Comment: NORMAL
Neisseria Gonorrhea: NEGATIVE
Trichomonas: NEGATIVE

## 2024-04-10 ENCOUNTER — Ambulatory Visit (HOSPITAL_COMMUNITY)
Admission: EM | Admit: 2024-04-10 | Discharge: 2024-04-10 | Disposition: A | Discharge status: Home / Self Care | Attending: Family Medicine | Admitting: Family Medicine

## 2024-04-10 ENCOUNTER — Encounter (HOSPITAL_COMMUNITY): Payer: Self-pay

## 2024-04-10 DIAGNOSIS — Z113 Encounter for screening for infections with a predominantly sexual mode of transmission: Secondary | ICD-10-CM | POA: Diagnosis not present

## 2024-04-10 LAB — HIV ANTIBODY (ROUTINE TESTING W REFLEX): HIV Screen 4th Generation wRfx: NONREACTIVE

## 2024-04-10 NOTE — ED Provider Notes (Signed)
 [facility]   [id number] 04/10/24 Arrival Time: 0807  ASSESSMENT & PLAN:  1. Screening for STDs (sexually transmitted diseases)    Likely very small skin tag on proximal penile shaft. Reassured. Does not look anything like genital herpes.    Discharge Instructions      We have sent testing for sexually transmitted infections. We will notify you of any positive results once they are received. If required, we will prescribe any medications you might need.  Please refrain from all sexual activity for at least the next seven days.     Pending: Labs Reviewed  RPR  HIV ANTIBODY (ROUTINE TESTING W REFLEX)    Will notify of any positive results. Instructed to refrain from sexual activity for at least seven days.  Reviewed expectations re: course of current medical issues. Questions answered. Outlined signs and symptoms indicating need for more acute intervention. Patient verbalized understanding. After Visit Summary given.   SUBJECTIVE:  Blake Swanson is a 23 y.o. male who presents with complaint of penile bump and request for STD screening. Small bump present for weeks; no changes; does shave area. Denies any pain. No tx PTA.   OBJECTIVE:  Vitals:   04/10/24 0837  BP: 123/78  Pulse: (!) 52  Resp: 16  Temp: 97.9 F (36.6 C)  TempSrc: Oral  SpO2: 97%     General appearance: alert, cooperative, appears stated age and no distress Abdomen: soft, non-tender GU: normal appearing genitalia; 1-2 mm flesh colored lesion on proximal penile shaft; without ulceration Skin: warm and dry Psychological: alert and cooperative; normal mood and affect.    Labs Reviewed  RPR  HIV ANTIBODY (ROUTINE TESTING W REFLEX)    No Known Allergies  Past Medical History:  Diagnosis Date   Sleep apnea    Family History  Problem Relation Age of Onset   Hypertension Maternal Grandmother    Social History   Socioeconomic History   Marital status: Single    Spouse  name: Not on file   Number of children: Not on file   Years of education: Not on file   Highest education level: Not on file  Occupational History   Not on file  Tobacco Use   Smoking status: Never   Smokeless tobacco: Never  Vaping Use   Vaping status: Never Used  Substance and Sexual Activity   Alcohol use: No   Drug use: No   Sexual activity: Not on file  Other Topics Concern   Not on file  Social History Narrative   Not on file   Social Drivers of Health   Financial Resource Strain: Not on file  Food Insecurity: Low Risk  (03/18/2024)   Received from [facility]   Hunger Vital Sign    Within the past 12 months, you worried that your food would run out before you got money to buy more: Never true    Within the past 12 months, the food you bought just didn't last and you didn't have money to get more. : Never true  Transportation Needs: No Transportation Needs (03/18/2024)   Received from [facility]    In the past 12 months, has lack of reliable transportation kept you from medical appointments, meetings, work or from getting things needed for daily living? : No  Physical Activity: Not on file  Stress: Not on file (11/27/2019)  Social Connections: Not on file  Intimate Partner Violence: Low Risk  (02/02/2021)   Received from [facility]  Intimate Partner Violence    Insults You: Not on file    Threatens You: Not on file    Screams at [place]: Not on file    Physically Hurt: Not on file    Intimate Partner Violence Score: Not on file           Rolinda Rogue, MD 04/10/24 [phone]

## 2024-04-10 NOTE — ED Triage Notes (Signed)
 Patient here today with c/o a small bump on the shaft of his penis X 2 days. Denies pain or itching.

## 2024-04-10 NOTE — Discharge Instructions (Signed)
 We have sent testing for sexually transmitted infections. We will notify you of any positive results once they are received. If required, we will prescribe any medications you might need.  Please refrain from all sexual activity for at least the next seven days.

## 2024-04-11 ENCOUNTER — Other Ambulatory Visit (HOSPITAL_COMMUNITY): Payer: Self-pay

## 2024-04-11 LAB — CYTOLOGY, (ORAL, ANAL, URETHRAL) ANCILLARY ONLY
Chlamydia: NEGATIVE
Comment: NEGATIVE
Comment: NEGATIVE
Comment: NORMAL
Neisseria Gonorrhea: NEGATIVE
Trichomonas: NEGATIVE

## 2024-04-11 LAB — RPR: RPR Ser Ql: NONREACTIVE

## 2024-04-11 MED ORDER — VALACYCLOVIR HCL 500 MG PO TABS
500.0000 mg | ORAL_TABLET | Freq: Every day | ORAL | 0 refills | Status: DC
  Filled 2024-04-11: qty 90, 90d supply, fill #0

## 2024-04-23 ENCOUNTER — Encounter: Payer: Self-pay | Admitting: Physician Assistant

## 2024-04-23 ENCOUNTER — Ambulatory Visit: Admitting: Physician Assistant

## 2024-04-23 ENCOUNTER — Other Ambulatory Visit: Payer: Self-pay | Admitting: Physician Assistant

## 2024-04-23 VITALS — BP 118/91 | HR 54 | Ht 66.0 in | Wt 165.0 lb

## 2024-04-23 DIAGNOSIS — Z113 Encounter for screening for infections with a predominantly sexual mode of transmission: Secondary | ICD-10-CM

## 2024-04-23 DIAGNOSIS — N489 Disorder of penis, unspecified: Secondary | ICD-10-CM

## 2024-04-23 DIAGNOSIS — R5383 Other fatigue: Secondary | ICD-10-CM | POA: Diagnosis not present

## 2024-04-23 NOTE — Patient Instructions (Addendum)
 VISIT SUMMARY:  During your visit, we discussed your concerns about a genital lesion, possible STD exposure, low energy levels, and sleep disturbances. We also reviewed your eczema management and slightly elevated cholesterol levels.  YOUR PLAN:  -GENITAL LESION: The lesion is likely a benign skin tag or related to your eczema, as it does not show signs typical of herpes. We will conduct a blood test for herpes antibodies and refer you to dermatology for further evaluation and possible removal of the lesion. You can use your steroid cream on the lesion for a few days, but avoid prolonged use due to the thin skin in that area.  -SCREENING FOR SEXUALLY TRANSMITTED INFECTIONS: Even though you have no known exposure or symptoms, we will conduct additional blood tests for HIV and syphilis, and a urine test for gonorrhea, chlamydia, and trichomonas for your peace of mind.  -DYSLIPIDEMIA: Dyslipidemia means you have slightly elevated cholesterol levels. We recommend regular exercise, increased water intake, and a low cholesterol diet, avoiding excessive fast food and cheese. Recheck your cholesterol levels in one year.  -SLEEP DISTURBANCE: Your sleep disturbance may be related to stress. We recommend practicing good sleep hygiene, such as avoiding naps and creating a bedtime routine to help clear your mind. If sleep issues persist, discuss them with your primary care provider.  -LOW ENERGY, POSSIBLE DEPRESSION: Your low energy levels may be related to stress or mild depression. We will conduct a testosterone  level test to check for any hormonal contributions to your low energy.  Insomnia Insomnia is a sleep disorder that makes it difficult to fall asleep or stay asleep. Insomnia can cause fatigue, low energy, difficulty concentrating, mood swings, and poor performance at work or school. There are three different ways to classify insomnia: Difficulty falling asleep. Difficulty staying asleep. Waking up  too early in the morning. Any type of insomnia can be long-term (chronic) or short-term (acute). Both are common. Short-term insomnia usually lasts for 3 months or less. Chronic insomnia occurs at least three times a week for longer than 3 months. What are the causes? Insomnia may be caused by another condition, situation, or substance, such as: Having certain mental health conditions, such as anxiety and depression. Using caffeine, alcohol, tobacco, or drugs. Having gastrointestinal conditions, such as gastroesophageal reflux disease (GERD). Having certain medical conditions. These include: Asthma. Alzheimer's disease. Stroke. Chronic pain. An overactive thyroid  gland (hyperthyroidism). Other sleep disorders, such as restless legs syndrome and sleep apnea. Menopause. Sometimes, the cause of insomnia may not be known. What increases the risk? Risk factors for insomnia include: Gender. Females are affected more often than males. Age. Insomnia is more common as people get older. Stress and certain medical and mental health conditions. Lack of exercise. Having an irregular work schedule. This may include working night shifts and traveling between different time zones. What are the signs or symptoms? If you have insomnia, the main symptom is having trouble falling asleep or having trouble staying asleep. This may lead to other symptoms, such as: Feeling tired or having low energy. Feeling nervous about going to sleep. Not feeling rested in the morning. Having trouble concentrating. Feeling irritable, anxious, or depressed. How is this diagnosed? This condition may be diagnosed based on: Your symptoms and medical history. Your health care provider may ask about: Your sleep habits. Any medical conditions you have. Your mental health. A physical exam. How is this treated? Treatment for insomnia depends on the cause. Treatment may focus on treating an underlying condition that  is  causing the insomnia. Treatment may also include: Medicines to help you sleep. Counseling or therapy. Lifestyle adjustments to help you sleep better. Follow these instructions at home: Eating and drinking  Limit or avoid alcohol, caffeinated beverages, and products that contain nicotine and tobacco, especially close to bedtime. These can disrupt your sleep. Do not eat a large meal or eat spicy foods right before bedtime. This can lead to digestive discomfort that can make it hard for you to sleep. Sleep habits  Keep a sleep diary to help you and your health care provider figure out what could be causing your insomnia. Write down: When you sleep. When you wake up during the night. How well you sleep and how rested you feel the next day. Any side effects of medicines you are taking. What you eat and drink. Make your bedroom a dark, comfortable place where it is easy to fall asleep. Put up shades or blackout curtains to block light from outside. Use a white noise machine to block noise. Keep the temperature cool. Limit screen use before bedtime. This includes: Not watching TV. Not using your smartphone, tablet, or computer. Stick to a routine that includes going to bed and waking up at the same times every day and night. This can help you fall asleep faster. Consider making a quiet activity, such as reading, part of your nighttime routine. Try to avoid taking naps during the day so that you sleep better at night. Get out of bed if you are still awake after 15 minutes of trying to sleep. Keep the lights down, but try reading or doing a quiet activity. When you feel sleepy, go back to bed. General instructions Take over-the-counter and prescription medicines only as told by your health care provider. Exercise regularly as told by your health care provider. However, avoid exercising in the hours right before bedtime. Use relaxation techniques to manage stress. Ask your health care provider to  suggest some techniques that may work well for you. These may include: Breathing exercises. Routines to release muscle tension. Visualizing peaceful scenes. Make sure that you drive carefully. Do not drive if you feel very sleepy. Keep all follow-up visits. This is important. Contact a health care provider if: You are tired throughout the day. You have trouble in your daily routine due to sleepiness. You continue to have sleep problems, or your sleep problems get worse. Get help right away if: You have thoughts about hurting yourself or someone else. Get help right away if you feel like you may hurt yourself or others, or have thoughts about taking your own life. Go to your nearest emergency room or: Call 911. Call the National Suicide Prevention Lifeline at 769-255-1720 or 988. This is open 24 hours a day. Text the Crisis Text Line at 214-325-2227. Summary Insomnia is a sleep disorder that makes it difficult to fall asleep or stay asleep. Insomnia can be long-term (chronic) or short-term (acute). Treatment for insomnia depends on the cause. Treatment may focus on treating an underlying condition that is causing the insomnia. Keep a sleep diary to help you and your health care provider figure out what could be causing your insomnia. This information is not intended to replace advice given to you by your health care provider. Make sure you discuss any questions you have with your health care provider. Document Revised: 08/02/2021 Document Reviewed: 08/02/2021 Elsevier Patient Education  2024 [employer].

## 2024-04-23 NOTE — Progress Notes (Unsigned)
 New Patient Office Visit  Subjective    Patient ID: Blake Swanson, male    DOB: Aug 30, 2001  Age: 23 y.o. MRN: [id number]  CC:  Chief Complaint  Patient presents with   Screening STD    Discussed the use of AI scribe software for clinical note transcription with the patient, who gave verbal consent to proceed.  History of Present Illness  Blake Swanson is a 23 year old male who presents with concerns about a genital lesion and possible STD exposure.  Two weeks ago, he noticed a bump in the genital area, previously evaluated as a raised area without vesicles. It was suggested to be a skin tag rather than genital herpes. He seeks re-evaluation to confirm it is not an STD-related lesion. There is no burning, discharge, or pain, and no vesicles or clusters of blisters typical of herpes are observed. He has eczema, which causes dry and cracking skin, and wonders if this could be related to the lesion.  He has not applied specific treatments to the lesion, using only antibacterial soap and alcohol. He uses steroid cream for eczema when symptoms worsen, particularly with seasonal changes.  He is concerned about low energy levels over the past few weeks, possibly due to stress or depression. Despite a routine of Bible study, exercise, and a healthy diet, he experiences sleep disturbances, waking early and having difficulty returning to sleep. Recent labs showed normal vitamin D, blood sugar, and thyroid  levels, with slightly elevated cholesterol. He does not smoke and maintains a healthy lifestyle with regular exercise and a balanced diet.   Results LABS   HDL: 43 mg/dL (92/85/7974)   LDL: 894 mg/dL (92/85/7974)   Outpatient Encounter Medications as of 04/23/2024  Medication Sig   ALLEGRA-D ALLERGY & CONGESTION 180-240 MG 24 hr tablet Take 1 tablet by mouth daily.   levothyroxine  (SYNTHROID ) 125 MCG tablet Take 1 tablet (125 mcg total) by mouth daily.   fluticasone  (CUTIVATE ) 0.05 %  cream Apply 1 Application topically to affected areas up to 2 (two) times daily as needed. (Patient not taking: Reported on 04/23/2024)   fluticasone  (FLONASE ) 50 MCG/ACT nasal spray Place 1-2 sprays into both nostrils daily. (Patient not taking: Reported on 04/23/2024)   levocetirizine (XYZAL ) 5 MG tablet Take 1 tablet (5 mg total) by mouth every evening. (Patient not taking: Reported on 04/23/2024)   valACYclovir  (VALTREX ) 500 MG tablet Take 1 tablet (500 mg total) by mouth daily. (Patient not taking: Reported on 04/23/2024)   valACYclovir  (VALTREX ) 500 MG tablet Take 1 tablet (500 mg total) by mouth daily. (Patient not taking: Reported on 04/23/2024)   No facility-administered encounter medications on file as of 04/23/2024.    Past Medical History:  Diagnosis Date   Sleep apnea     History reviewed. No pertinent surgical history.  Family History  Problem Relation Age of Onset   Hypertension Maternal Grandmother     Social History   Socioeconomic History   Marital status: Single    Spouse name: Not on file   Number of children: Not on file   Years of education: Not on file   Highest education level: Not on file  Occupational History   Not on file  Tobacco Use   Smoking status: Never   Smokeless tobacco: Never  Vaping Use   Vaping status: Never Used  Substance and Sexual Activity   Alcohol use: No   Drug use: No   Sexual activity: Not on file  Other Topics  Concern   Not on file  Social History Narrative   Not on file   Social Drivers of Health   Financial Resource Strain: Not on file  Food Insecurity: Low Risk  (03/18/2024)   Received from [facility]   Hunger Vital Sign    Within the past 12 months, you worried that your food would run out before you got money to buy more: Never true    Within the past 12 months, the food you bought just didn't last and you didn't have money to get more. : Never true  Transportation Needs: No Transportation Needs (03/18/2024)    Received from [facility]    In the past 12 months, has lack of reliable transportation kept you from medical appointments, meetings, work or from getting things needed for daily living? : No  Physical Activity: Not on file  Stress: Not on file (11/27/2019)  Social Connections: Not on file  Intimate Partner Violence: Low Risk  (02/02/2021)   Received from [facility]   Intimate Partner Violence    Insults You: Not on file    Threatens You: Not on file    Screams at You: Not on file    Physically Hurt: Not on file    Intimate Partner Violence Score: Not on file    Review of Systems  Constitutional:  Negative for chills and fever.  HENT: Negative.    Eyes: Negative.   Respiratory:  Negative for shortness of breath.   Cardiovascular:  Negative for chest pain.  Gastrointestinal: Negative.   Genitourinary:  Negative for dysuria and hematuria.  Musculoskeletal: Negative.   Skin: Negative.   Neurological: Negative.   Endo/Heme/Allergies: Negative.   Psychiatric/Behavioral: Negative.          Objective    BP (!) 118/91 (BP Location: Left Arm, Patient Position: Sitting, Cuff Size: Large)   Pulse (!) 54   Ht 5' 6 (1.676 m)   Wt 165 lb (74.8 kg)   SpO2 97%   BMI 26.63 kg/m   Physical Exam Vitals and nursing note reviewed.  Constitutional:      Appearance: Normal appearance.  HENT:     Head: Normocephalic and atraumatic.     Right Ear: External ear normal.     Left Ear: External ear normal.     Nose: Nose normal.     Mouth/Throat:     Mouth: Mucous membranes are moist.     Pharynx: Oropharynx is clear.  Eyes:     Extraocular Movements: Extraocular movements intact.     Conjunctiva/sclera: Conjunctivae normal.     Pupils: Pupils are equal, round, and reactive to light.  Cardiovascular:     Rate and Rhythm: Normal rate and regular rhythm.     Pulses: Normal pulses.     Heart sounds: Normal heart sounds.  Pulmonary:     Effort: Pulmonary effort  is normal.     Breath sounds: Normal breath sounds.  Genitourinary:    Comments: Deferred  Musculoskeletal:     Cervical back: Normal range of motion and neck supple.  Skin:    General: Skin is warm and dry.  Neurological:     General: No focal deficit present.     Mental Status: He is alert and oriented to person, place, and time.  Psychiatric:        Mood and Affect: Mood normal.        Behavior: Behavior normal.        Thought Content:  Thought content normal.        Judgment: Judgment normal.       Assessment & Plan:   Problem List Items Addressed This Visit   None  Assessment and Plan Genital lesion, likely benign (skin tag or eczema-related) A genital lesion, likely a skin tag or eczema-related, with no vesicles, burning, or discharge, making herpes unlikely. Eczema may contribute to skin changes. - Order blood test for herpes antibodies. - Refer to dermatology for further evaluation and potential removal of the lesion. - Advise use of steroid cream on the lesion for a few days, cautioning against prolonged use due to thin skin in the area.  Eczema Chronic eczema with dry, cracking skin, particularly during seasonal changes. He uses steroid cream as needed. - Continue use of steroid cream as needed for eczema flare-ups. - Discuss potential for dermatology to assist with eczema management.  Screening for sexually transmitted infections Screening for STIs conducted 13 days ago. He requests additional screening for peace of mind, despite no known exposure or symptoms. - Order blood tests for HIV and syphilis. - Order urine test for gonorrhea, chlamydia, and trichomonas.  Dyslipidemia Slightly elevated LDL cholesterol at 105 mg/dL and HDL at 43 mg/dL. Overall cholesterol within acceptable limits. - Advise regular exercise and increased water intake. - Recommend a low cholesterol diet, avoiding excessive fast food and cheese. - Recheck cholesterol levels in one  year.  Sleep disturbance Wakes up at 2-3 AM and stays awake until 11 AM. Engages in activities during this time. No daytime napping. Possible stress-related sleep disturbance due to life changes and career considerations. - Advise on sleep hygiene, including avoiding naps and creating a bedtime routine to clear the mind. - Encourage discussion with primary care provider if sleep issues persist.  Low energy, possible depression Experiencing low energy for a few weeks, possibly related to stress or mild depression. Normal vitamin D, blood sugar, and thyroid  function. - Order testosterone  level test to evaluate for potential hormonal contribution to low energy. No follow-ups on file.   Kirk RAMAN Mayers, PA-C

## 2024-04-25 ENCOUNTER — Encounter: Payer: Self-pay | Admitting: Physician Assistant

## 2024-04-25 ENCOUNTER — Ambulatory Visit: Payer: Self-pay | Admitting: Physician Assistant

## 2024-04-25 LAB — HSV 1 AND 2 AB, IGG
HSV 1 Glycoprotein G Ab, IgG: NONREACTIVE
HSV 2 IgG, Type Spec: REACTIVE — AB

## 2024-04-25 LAB — TESTOSTERONE: Testosterone: 443 ng/dL (ref 264–916)

## 2024-04-25 LAB — HIV ANTIBODY (ROUTINE TESTING W REFLEX): HIV Screen 4th Generation wRfx: NONREACTIVE

## 2024-04-25 LAB — RPR: RPR Ser Ql: NONREACTIVE

## 2024-05-03 LAB — CHLAMYDIA/GONOCOCCUS/TRICHOMONAS, NAA

## 2024-05-03 LAB — SPECIMEN STATUS REPORT

## 2024-05-07 ENCOUNTER — Ambulatory Visit: Payer: Self-pay | Admitting: Physician Assistant

## 2024-05-07 DIAGNOSIS — Z113 Encounter for screening for infections with a predominantly sexual mode of transmission: Secondary | ICD-10-CM

## 2024-05-08 DIAGNOSIS — F99 Mental disorder, not otherwise specified: Secondary | ICD-10-CM | POA: Diagnosis not present

## 2024-05-14 DIAGNOSIS — F99 Mental disorder, not otherwise specified: Secondary | ICD-10-CM | POA: Diagnosis not present

## 2024-05-23 DIAGNOSIS — F99 Mental disorder, not otherwise specified: Secondary | ICD-10-CM | POA: Diagnosis not present

## 2024-05-28 ENCOUNTER — Ambulatory Visit

## 2024-05-30 DIAGNOSIS — F99 Mental disorder, not otherwise specified: Secondary | ICD-10-CM | POA: Diagnosis not present

## 2024-06-04 DIAGNOSIS — F419 Anxiety disorder, unspecified: Secondary | ICD-10-CM | POA: Diagnosis not present

## 2024-06-04 DIAGNOSIS — Z23 Encounter for immunization: Secondary | ICD-10-CM | POA: Diagnosis not present

## 2024-06-04 DIAGNOSIS — M62830 Muscle spasm of back: Secondary | ICD-10-CM | POA: Diagnosis not present

## 2024-06-06 DIAGNOSIS — F99 Mental disorder, not otherwise specified: Secondary | ICD-10-CM | POA: Diagnosis not present

## 2024-06-10 DIAGNOSIS — Z9189 Other specified personal risk factors, not elsewhere classified: Secondary | ICD-10-CM | POA: Diagnosis not present

## 2024-06-10 DIAGNOSIS — E890 Postprocedural hypothyroidism: Secondary | ICD-10-CM | POA: Diagnosis not present

## 2024-06-11 ENCOUNTER — Ambulatory Visit (HOSPITAL_COMMUNITY)
Admission: RE | Admit: 2024-06-11 | Discharge: 2024-06-11 | Disposition: A | Discharge status: Home / Self Care | Payer: Self-pay | Source: Ambulatory Visit | Attending: Family Medicine | Admitting: Family Medicine

## 2024-06-11 ENCOUNTER — Encounter (HOSPITAL_COMMUNITY): Payer: Self-pay

## 2024-06-11 VITALS — BP 128/87 | HR 83 | Temp 98.3°F | Resp 16

## 2024-06-11 DIAGNOSIS — L739 Follicular disorder, unspecified: Secondary | ICD-10-CM | POA: Diagnosis not present

## 2024-06-11 DIAGNOSIS — Z202 Contact with and (suspected) exposure to infections with a predominantly sexual mode of transmission: Secondary | ICD-10-CM | POA: Insufficient documentation

## 2024-06-11 HISTORY — DX: Herpesviral infection of urogenital system, unspecified: A60.00

## 2024-06-11 NOTE — ED Provider Notes (Signed)
 [facility]    CSN: [id number] Arrival date & time: 06/11/24  0820      History   Chief Complaint Chief Complaint  Patient presents with   SEXUALLY TRANSMITTED DISEASE    STD panel and blisters on thigh - Entered by patient   Mass    HPI Blake Swanson is a 23 y.o. male.   HPI Here for bumps on his left inguinal area.  He noticed them in the last few days as he was shaving.  No pain there and no drainage.  They are not increasing in size.  He saw an urgent care yesterday and they declined to do any testing.  He would like to be screened for STDs today.  Of note he was seen with the mobile clinic in August for bumps in the genital area.  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  HIV was negative.  NKDA  Past Medical History:  Diagnosis Date   Genital herpes    Sleep apnea     Patient Active Problem List   Diagnosis Date Noted   Obstructive sleep apnea 11/29/2016   Seasonal and perennial allergic rhinitis 11/29/2016   Graves disease 11/29/2016    History reviewed. No pertinent surgical history.     Home Medications    Prior to Admission medications   Medication Sig Start Date End Date Taking? Authorizing Provider  ALLEGRA-D ALLERGY & CONGESTION 180-240 MG 24 hr tablet Take 1 tablet by mouth daily. 11/24/16  Yes [provider]  levothyroxine  (SYNTHROID ) 125 MCG tablet Take 1 tablet (125 mcg total) by mouth daily. 03/20/24  Yes   valACYclovir  (VALTREX ) 500 MG tablet Take 1 tablet (500 mg total) by mouth daily. 04/11/24  Yes   fluticasone  (CUTIVATE ) 0.05 % cream Apply 1 Application topically to affected areas up to 2 (two) times daily as needed. Patient not taking: No sig reported 02/02/24   Shona Rush, MD  fluticasone  (FLONASE ) 50 MCG/ACT nasal spray Place 1-2 sprays into both nostrils daily. Patient not taking: No sig reported 12/25/23    Frutoso Luz, MD  levocetirizine (XYZAL ) 5 MG tablet Take 1 tablet (5 mg total) by mouth every evening. Patient not taking: No sig reported 12/29/23   Frutoso Luz, MD  valACYclovir  (VALTREX ) 500 MG tablet Take 1 tablet (500 mg total) by mouth daily. Patient not taking: Reported on 04/23/2024 01/18/24   Jesus Elberta Gainer, FNP    Family History Family History  Problem Relation Age of Onset   Hypertension Maternal Grandmother     Social History Social History   Tobacco Use   Smoking status: Never   Smokeless tobacco: Never  Vaping Use   Vaping status: Never Used  Substance Use Topics   Alcohol use: No   Drug use: No     Allergies   Patient has no known allergies.   Review of Systems Review of Systems   Physical Exam Triage Vital Signs ED Triage Vitals  Encounter Vitals Group     BP 06/11/24 0837 128/87     Girls Systolic BP Percentile --      Girls Diastolic BP Percentile --      Boys Systolic BP Percentile --      Boys Diastolic BP Percentile --      Pulse Rate 06/11/24 0837 83     Resp 06/11/24 0837 16     Temp  06/11/24 0837 98.3 F (36.8 C)     Temp Source 06/11/24 0837 Oral     SpO2 06/11/24 0837 97 %     Weight --      Height --      Head Circumference --      Peak Flow --      Pain Score 06/11/24 0836 0     Pain Loc --      Pain Education --      Exclude from Growth Chart --    No data found.  Updated Vital Signs BP 128/87 (BP Location: Left Arm)   Pulse 83   Temp 98.3 F (36.8 C) (Oral)   Resp 16   SpO2 97%   Visual Acuity Right Eye Distance:   Left Eye Distance:   Bilateral Distance:    Right Eye Near:   Left Eye Near:    Bilateral Near:     Physical Exam Vitals reviewed.  Constitutional:      General: He is not in acute distress.    Appearance: He is not ill-appearing, toxic-appearing or diaphoretic.  Genitourinary:    Comments: Chaperone present at the time of exam.  There are about 3 slightly raised 2 mm bumps on the  left inguinal area.  There is no ulceration and no erythema and no tenderness. Skin:    Coloration: Skin is not pale.  Neurological:     Mental Status: He is alert and oriented to person, place, and time.  Psychiatric:        Behavior: Behavior normal.      UC Treatments / Results  Labs (all labs ordered are listed, but only abnormal results are displayed) Labs Reviewed  CYTOLOGY, (ORAL, ANAL, URETHRAL) ANCILLARY ONLY    EKG   Radiology No results found.  Procedures Procedures (including critical care time)  Medications Ordered in UC Medications - No data to display  Initial Impression / Assessment and Plan / UC Course  I have reviewed the triage vital signs and the nursing notes.  Pertinent labs & imaging results that were available during my care of the patient were reviewed by me and considered in my medical decision making (see chart for details).     Urethral self swab is done and staff will notify him of any positives and treat per protocol. He also just had testing for RPR and HIV in August, so I am not repeating that.  I have discussed with him that these lesions are not typical at all for anything like herpes and he does not need testing for herpes of these lesions.  I doubt they are warts, and I suspect they are more of a folliculitis due to his shaving.  I have asked him to quit shaving.  He has an appointment already with his family practice primary care tomorrow and have asked him to keep that appointment. Final Clinical Impressions(s) / UC Diagnoses   Final diagnoses:  Folliculitis  Potential exposure to STD     Discharge Instructions      Staff will notify you if there is anything positive on the swab. It can take 2-3 days for the tests to result, depending on the day of the week your test was taken. You will only be notified if there are any positives on the testing; test results will also go to your MyChart if you are signed up for MyChart.    Please keep your follow-up with your primary care for tomorrow  Stop shaving in  the involved area     ED Prescriptions   None    PDMP not reviewed this encounter.   Vonna Sharlet POUR, MD 06/11/24 [phone]

## 2024-06-11 NOTE — ED Triage Notes (Signed)
 Pt states he has some bumps on his left thigh he thinks from shaving but would like them looked at. He was seen yesterday at [facility] and advised to see PCP for possible wart nothing to swab for herpes.

## 2024-06-11 NOTE — Discharge Instructions (Signed)
 Staff will notify you if there is anything positive on the swab. It can take 2-3 days for the tests to result, depending on the day of the week your test was taken. You will only be notified if there are any positives on the testing; test results will also go to your MyChart if you are signed up for MyChart.   Please keep your follow-up with your primary care for tomorrow  Stop shaving in the involved area

## 2024-06-12 DIAGNOSIS — A6001 Herpesviral infection of penis: Secondary | ICD-10-CM | POA: Diagnosis not present

## 2024-06-12 DIAGNOSIS — L739 Follicular disorder, unspecified: Secondary | ICD-10-CM | POA: Diagnosis not present

## 2024-06-12 LAB — CYTOLOGY, (ORAL, ANAL, URETHRAL) ANCILLARY ONLY
Chlamydia: NEGATIVE
Comment: NEGATIVE
Comment: NEGATIVE
Comment: NORMAL
Neisseria Gonorrhea: NEGATIVE
Trichomonas: NEGATIVE

## 2024-06-13 DIAGNOSIS — F99 Mental disorder, not otherwise specified: Secondary | ICD-10-CM | POA: Diagnosis not present

## 2024-06-20 DIAGNOSIS — F99 Mental disorder, not otherwise specified: Secondary | ICD-10-CM | POA: Diagnosis not present

## 2024-06-24 ENCOUNTER — Other Ambulatory Visit (HOSPITAL_COMMUNITY): Payer: Self-pay

## 2024-07-08 ENCOUNTER — Other Ambulatory Visit (HOSPITAL_COMMUNITY): Payer: Self-pay

## 2024-07-08 MED ORDER — VALACYCLOVIR HCL 1 G PO TABS
1000.0000 mg | ORAL_TABLET | Freq: Every day | ORAL | 3 refills | Status: AC
  Filled 2024-07-08: qty 90, 90d supply, fill #0

## 2024-07-11 ENCOUNTER — Other Ambulatory Visit (HOSPITAL_COMMUNITY): Payer: Self-pay

## 2024-07-11 DIAGNOSIS — F99 Mental disorder, not otherwise specified: Secondary | ICD-10-CM | POA: Diagnosis not present

## 2024-07-25 DIAGNOSIS — F99 Mental disorder, not otherwise specified: Secondary | ICD-10-CM | POA: Diagnosis not present

## 2024-08-15 DIAGNOSIS — F99 Mental disorder, not otherwise specified: Secondary | ICD-10-CM | POA: Diagnosis not present

## 2024-08-22 DIAGNOSIS — F99 Mental disorder, not otherwise specified: Secondary | ICD-10-CM | POA: Diagnosis not present

## 2024-09-11 ENCOUNTER — Other Ambulatory Visit (HOSPITAL_COMMUNITY): Payer: Self-pay

## 2024-09-12 ENCOUNTER — Other Ambulatory Visit (HOSPITAL_COMMUNITY): Payer: Self-pay

## 2024-09-12 MED ORDER — IBUPROFEN 800 MG PO TABS
800.0000 mg | ORAL_TABLET | Freq: Three times a day (TID) | ORAL | 0 refills | Status: AC | PRN
  Filled 2024-09-12: qty 40, 10d supply, fill #0

## 2024-09-12 MED ORDER — AMOXICILLIN 500 MG PO CAPS
500.0000 mg | ORAL_CAPSULE | Freq: Four times a day (QID) | ORAL | 0 refills | Status: AC
  Filled 2024-09-12: qty 40, 10d supply, fill #0

## 2024-09-13 ENCOUNTER — Other Ambulatory Visit (HOSPITAL_COMMUNITY): Payer: Self-pay

## 2024-09-17 ENCOUNTER — Other Ambulatory Visit (HOSPITAL_COMMUNITY): Payer: Self-pay

## 2024-10-02 ENCOUNTER — Other Ambulatory Visit (HOSPITAL_COMMUNITY): Payer: Self-pay

## 2024-10-09 ENCOUNTER — Other Ambulatory Visit (HOSPITAL_COMMUNITY): Payer: Self-pay

## 2024-10-09 MED ORDER — DIAZEPAM 10 MG PO TABS
10.0000 mg | ORAL_TABLET | Freq: Once | ORAL | 0 refills | Status: AC
  Filled 2024-10-09: qty 6, 6d supply, fill #0

## 2024-10-11 ENCOUNTER — Other Ambulatory Visit (HOSPITAL_COMMUNITY): Payer: Self-pay

## 2024-10-11 MED ORDER — PENICILLIN V POTASSIUM 500 MG PO TABS
500.0000 mg | ORAL_TABLET | Freq: Four times a day (QID) | ORAL | 0 refills | Status: AC
  Filled 2024-10-11: qty 40, 10d supply, fill #0

## 2024-12-03 ENCOUNTER — Ambulatory Visit: Admitting: Physician Assistant

## 2025-01-08 ENCOUNTER — Ambulatory Visit: Payer: Self-pay | Admitting: Nurse Practitioner
